# Patient Record
Sex: Female | Born: 1990 | Race: White | Hispanic: No | Marital: Single | Smoking: Never smoker
Health system: Southern US, Community
[De-identification: ages and names within clinical notes are randomized; demographics above are authoritative.]

## PROBLEM LIST (undated history)

## (undated) HISTORY — PX: WISDOM TOOTH EXTRACTION: SHX21

## (undated) HISTORY — PX: ADENOIDECTOMY: SUR15

## (undated) HISTORY — PX: TONSILLECTOMY: SUR1361

---
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
## 2016-12-22 ENCOUNTER — Ambulatory Visit (INDEPENDENT_AMBULATORY_CARE_PROVIDER_SITE_OTHER): Payer: BLUE CROSS/BLUE SHIELD | Admitting: Allergy and Immunology

## 2016-12-22 ENCOUNTER — Encounter: Payer: Self-pay | Admitting: Allergy and Immunology

## 2016-12-22 VITALS — BP 120/70 | HR 66 | Temp 98.3°F | Resp 17 | Ht 62.99 in | Wt 127.2 lb

## 2016-12-22 DIAGNOSIS — J3089 Other allergic rhinitis: Secondary | ICD-10-CM

## 2016-12-22 DIAGNOSIS — Z8709 Personal history of other diseases of the respiratory system: Secondary | ICD-10-CM | POA: Diagnosis not present

## 2016-12-22 DIAGNOSIS — Z91018 Allergy to other foods: Secondary | ICD-10-CM

## 2016-12-22 DIAGNOSIS — L500 Allergic urticaria: Secondary | ICD-10-CM | POA: Diagnosis not present

## 2016-12-22 MED ORDER — FLUTICASONE PROPIONATE 50 MCG/ACT NA SUSP: 2.0000 | Freq: Every day | NASAL | 5 refills | Status: AC | PRN

## 2016-12-22 NOTE — Assessment & Plan Note (Signed)
Unclear etiology. Skin tests to select food allergens were negative today. NSAIDs and emotional stress commonly exacerbate urticaria but are not the underlying etiology in this case. Physical urticarias are negative by history (i.e. pressure-induced, temperature, vibration, solar, etc.). History and lesions are not consistent with urticaria pigmentosa so I am not suspicious for mastocytosis. There are no concomitant symptoms concerning for anaphylaxis.   We will not order labs at this time, however, if lesions recur, persist, progress, or change in character, we will assess potential etiologies with screening labs.  For symptom relief, patient is to take oral antihistamines as directed.  If needed, take Xyzal or Allegra.  Should symptoms recur, a journal is to be kept recording any foods eaten, beverages consumed, medications taken within a 6 hour period prior to the onset of symptoms, as well as record activities being performed, and environmental conditions. For any symptoms concerning for anaphylaxis, 911 is to be called immediately.

## 2016-12-22 NOTE — Assessment & Plan Note (Signed)
The patient's history suggests shellfish allergy, however food allergen skin tests today were negative despite a positive histamine control.  The negative predictive value of food allergen skin testing is excellent, however there is still a 5% chance that the allergy exists.  A laboratory order form has been provided for serum specific IgE against shellfish panel.  If laboratory tests are negative/within normal limits, we will proceed to open graded oral challenge.  Until this food allergy has been definitively ruled out, she will continue careful avoidance of shellfish.

## 2016-12-22 NOTE — Progress Notes (Signed)
New Patient Note  RE: Mary Carter MRN: [id number] DOB: 1990/07/31 Date of Office Visit: 12/22/2016  Referring provider: No ref. provider found Primary care provider: [place], [facility]  Chief Complaint: Allergic Reaction (shellfish) and Allergy Testing   History of present illness: Mary Carter is a 26 y.o. female presents today for evaluation of possible food allergy.  She states that when she was approximately 26 years old she consumed shellfish on 2 separate occasions and vomited shortly after each of the meals.  Her mother has a shellfish allergy and therefore advocated strict avoidance of shellfish.  She had borderline positive results to shellfish with food allergen skin testing in 2009 by Dr. Willa RoughHicks.  [name] is interested in reassessing her food allergy status.  She had childhood asthma which has improved/resolved over the past several years.  Her asthma had been flared by upper respiratory tract infections. [name] states that approximately 2 weeks ago she was sitting in grass and developed "red, itchy bumps" on her legs.  She did not experience concomitant cardiopulmonary symptoms and the urticaria resolved within a few hours.  She has experienced some nasal symptoms this spring/summer consistent with allergic rhinitis, however she is uncertain if the symptoms were related to a viral infection or allergen exposure.   Assessment and plan: History of food allergy The patient's history suggests shellfish allergy, however food allergen skin tests today were negative despite a positive histamine control.  The negative predictive value of food allergen skin testing is excellent, however there is still a 5% chance that the allergy exists.  A laboratory order form has been provided for serum specific IgE against shellfish panel.  If laboratory tests are negative/within normal limits, we will proceed to open graded oral challenge.  Until this food allergy  has been definitively ruled out, she will continue careful avoidance of shellfish.  Perennial allergic rhinitis  Aeroallergen avoidance measures have been discussed and provided in written form.  A prescription has been provided for fluticasone nasal spray, 2 sprays per nostril daily as needed. Proper nasal spray technique has been discussed and demonstrated.  I have also recommended nasal saline spray (i.e. Simply Saline) as needed and prior to medicated nasal sprays.  I have recommended over-the-counter Xyzal or Allegra if needed.  Urticaria Unclear etiology. Skin tests to select food allergens were negative today. NSAIDs and emotional stress commonly exacerbate urticaria but are not the underlying etiology in this case. Physical urticarias are negative by history (i.e. pressure-induced, temperature, vibration, solar, etc.). History and lesions are not consistent with urticaria pigmentosa so I am not suspicious for mastocytosis. There are no concomitant symptoms concerning for anaphylaxis.   We will not order labs at this time, however, if lesions recur, persist, progress, or change in character, we will assess potential etiologies with screening labs.  For symptom relief, patient is to take oral antihistamines as directed.  If needed, take Xyzal or Allegra.  Should symptoms recur, a journal is to be kept recording any foods eaten, beverages consumed, medications taken within a 6 hour period prior to the onset of symptoms, as well as record activities being performed, and environmental conditions. For any symptoms concerning for anaphylaxis, 911 is to be called immediately.  History of asthma Quiescent.  Unless lower respiratory symptoms arise, we will not treat or evaluate further.   Meds ordered this encounter  Medications  . fluticasone (FLONASE) 50 MCG/ACT nasal spray    Sig: Place 2 sprays into both nostrils daily  as needed for allergies or rhinitis.    Dispense:  16 g     Refill:  5    Diagnostics: Spirometry:  Normal with an FEV1 of 84% predicted.  Please see scanned spirometry results for details. Environmental skin testing: Positive to dust mite antigen. Food allergen skin testing:  Negative despite a positive histamine control.    Physical examination: Blood pressure 120/70, pulse 66, temperature 98.3 F (36.8 C), temperature source Oral, resp. rate 17, height 5' 2.99" (1.6 m), weight 127 lb 3.2 oz (57.7 kg), SpO2 98 %.  General: Alert, interactive, in no acute distress. HEENT: TMs pearly gray, turbinates mildly edematous without discharge, post-pharynx unremarkable. Neck: Supple without lymphadenopathy. Lungs: Clear to auscultation without wheezing, rhonchi or rales. CV: Normal S1, S2 without murmurs. Abdomen: Nondistended, nontender. Skin: Warm and dry, without lesions or rashes. Extremities:  No clubbing, cyanosis or edema. Neuro:   Grossly intact.  Review of systems:  Review of systems negative except as noted in HPI / PMHx or noted below: Review of Systems  Constitutional: Negative.   HENT: Negative.   Eyes: Negative.   Respiratory: Negative.   Cardiovascular: Negative.   Gastrointestinal: Negative.   Genitourinary: Negative.   Musculoskeletal: Negative.   Skin: Negative.   Neurological: Negative.   Endo/Heme/Allergies: Negative.   Psychiatric/Behavioral: Negative.     Past medical history:  Food allergy.  Past surgical history:  Past Surgical History:  Procedure Laterality Date  . ADENOIDECTOMY    . TONSILLECTOMY    . WISDOM TOOTH EXTRACTION Bilateral     Family history: Family History  Problem Relation Age of Onset  . Food Allergy Mother        shellfish  . Asthma Maternal Grandmother   . Asthma Maternal Grandfather   . Asthma Paternal Grandmother   . Allergic rhinitis Neg Hx   . Angioedema Neg Hx   . Atopy Neg Hx   . Eczema Neg Hx   . Immunodeficiency Neg Hx   . Urticaria Neg Hx     Social history: Social  History   Social History  . Marital status: Single    Spouse name: N/A  . Number of children: N/A  . Years of education: N/A   Occupational History  . Not on file.   Social History Main Topics  . Smoking status: Never Smoker  . Smokeless tobacco: Never Used  . Alcohol use Yes  . Drug use: No  . Sexual activity: Not on file   Other Topics Concern  . Not on file   Social History Narrative  . No narrative on file   Environmental History: The patient lives in a house built in the 1930s with hardwood floors throughout, gas heat, and central air.  There is a cat in the house which does not have access to her bedroom.  There is no known mold/water damage in the home.  She is nonsmoker.  Allergies as of 12/22/2016   No Known Allergies     Medication List       Accurate as of 12/22/16 12:44 PM. Always use your most recent med list.          fluticasone 50 MCG/ACT nasal spray Commonly known as:  FLONASE Place 2 sprays into both nostrils daily as needed for allergies or rhinitis.   ranitidine 75 MG tablet Commonly known as:  ZANTAC Take 75 mg by mouth 2 (two) times daily as needed for heartburn.       Known medication allergies: No  Known Allergies  I appreciate the opportunity to take part in [place] care. Please do not hesitate to contact me with questions.  Sincerely,   R. Jorene Guest, MD

## 2016-12-22 NOTE — Assessment & Plan Note (Signed)
   Aeroallergen avoidance measures have been discussed and provided in written form.  A prescription has been provided for fluticasone nasal spray, 2 sprays per nostril daily as needed. Proper nasal spray technique has been discussed and demonstrated.  I have also recommended nasal saline spray (i.e. Simply Saline) as needed and prior to medicated nasal sprays.  I have recommended over-the-counter Xyzal or Allegra if needed.

## 2016-12-22 NOTE — Assessment & Plan Note (Signed)
Quiescent.  Unless lower respiratory symptoms arise, we will not treat or evaluate further. 

## 2016-12-22 NOTE — Patient Instructions (Addendum)
History of food allergy The patient's history suggests shellfish allergy, however food allergen skin tests today were negative despite a positive histamine control.  The negative predictive value of food allergen skin testing is excellent, however there is still a 5% chance that the allergy exists.  A laboratory order form has been provided for serum specific IgE against shellfish panel.  If laboratory tests are negative/within normal limits, we will proceed to open graded oral challenge.  Until this food allergy has been definitively ruled out, she will continue careful avoidance of shellfish.  Perennial allergic rhinitis  Aeroallergen avoidance measures have been discussed and provided in written form.  A prescription has been provided for fluticasone nasal spray, 2 sprays per nostril daily as needed. Proper nasal spray technique has been discussed and demonstrated.  I have also recommended nasal saline spray (i.e. Simply Saline) as needed and prior to medicated nasal sprays.  I have recommended over-the-counter Xyzal or Allegra if needed.  Urticaria Unclear etiology. Skin tests to select food allergens were negative today. NSAIDs and emotional stress commonly exacerbate urticaria but are not the underlying etiology in this case. Physical urticarias are negative by history (i.e. pressure-induced, temperature, vibration, solar, etc.). History and lesions are not consistent with urticaria pigmentosa so I am not suspicious for mastocytosis. There are no concomitant symptoms concerning for anaphylaxis.   We will not order labs at this time, however, if lesions recur, persist, progress, or change in character, we will assess potential etiologies with screening labs.  For symptom relief, patient is to take oral antihistamines as directed.  If needed, take Xyzal or Allegra.  Should symptoms recur, a journal is to be kept recording any foods eaten, beverages consumed, medications taken within a 6  hour period prior to the onset of symptoms, as well as record activities being performed, and environmental conditions. For any symptoms concerning for anaphylaxis, 911 is to be called immediately.  History of asthma Quiescent.  Unless lower respiratory symptoms arise, we will not treat or evaluate further.   When lab results have returned the patient will be called with further recommendations and follow up instructions.   Control of House Dust Mite Allergen  House dust mites play a major role in allergic asthma and rhinitis.  They occur in environments with high humidity wherever human skin, the food for dust mites is found. High levels have been detected in dust obtained from mattresses, pillows, carpets, upholstered furniture, bed covers, clothes and soft toys.  The principal allergen of the house dust mite is found in its feces.  A gram of dust may contain 1,000 mites and 250,000 fecal particles.  Mite antigen is easily measured in the air during house cleaning activities.    1. Encase mattresses, including the box spring, and pillow, in an air tight cover.  Seal the zipper end of the encased mattresses with wide adhesive tape. 2. Wash the bedding in water of 130 degrees Farenheit weekly.  Avoid cotton comforters/quilts and flannel bedding: the most ideal bed covering is the dacron comforter. 3. Remove all upholstered furniture from the bedroom. 4. Remove carpets, carpet padding, rugs, and non-washable window drapes from the bedroom.  Wash drapes weekly or use plastic window coverings. 5. Remove all non-washable stuffed toys from the bedroom.  Wash stuffed toys weekly. 6. Have the room cleaned frequently with a vacuum cleaner and a damp dust-mop.  The patient should not be in a room which is being cleaned and should wait 1 hour after cleaning  before going into the room. 7. Close and seal all heating outlets in the bedroom.  Otherwise, the room will become filled with dust-laden air.  An  electric heater can be used to heat the room. 8. Reduce indoor humidity to less than 50%.  Do not use a humidifier.

## 2016-12-25 LAB — ALLERGY-SHELLFISH PANEL
Clams: <0.1 kU/L
Crab: <0.1 kU/L
Lobster: <0.1 kU/L
Shrimp IgE: <0.1 kU/L

## 2017-02-23 ENCOUNTER — Ambulatory Visit (INDEPENDENT_AMBULATORY_CARE_PROVIDER_SITE_OTHER): Payer: BLUE CROSS/BLUE SHIELD | Admitting: Allergy and Immunology

## 2017-02-23 ENCOUNTER — Encounter: Payer: Self-pay | Admitting: Allergy and Immunology

## 2017-02-23 VITALS — BP 120/68 | HR 60 | Resp 18

## 2017-02-23 DIAGNOSIS — J3089 Other allergic rhinitis: Secondary | ICD-10-CM

## 2017-02-23 DIAGNOSIS — Z91018 Allergy to other foods: Secondary | ICD-10-CM | POA: Diagnosis not present

## 2017-02-23 DIAGNOSIS — L500 Allergic urticaria: Secondary | ICD-10-CM

## 2017-02-23 NOTE — Assessment & Plan Note (Signed)
Quiescent.  Should significant symptoms recur or new symptoms occur, a journal is to be kept recording any foods eaten, beverages consumed, medications taken, activities performed, and environmental conditions within a 6 hour time period prior to the onset of symptoms. For any symptoms concerning for anaphylaxis, epinephrine is to be administered and 911 is to be called immediately.

## 2017-02-23 NOTE — Progress Notes (Signed)
    Follow-up Note  RE: Raynelle JanKathryn L Zanetti MRN: [id number] DOB: 04-06-1991 Date of Office Visit: 02/23/2017  Primary care provider: [place], [facility] Referring provider: [place]oe [place], [place]*  History of present illness: Raynelle JanKathryn L Vanorman is a 26 y.o. female with history of food allergy, allergic rhinitis, and history of urticaria presenting today for open graded oral challenge against shrimp.  She has had negative skin test for shellfish and her serum specific IgE was negative/within normal limits for shellfish panel.  Her nasal symptoms a been well-controlled and she has not had recurrence of urticaria in the interval since her previous visit.  She has no new complaints today.   Assessment and plan: History of food allergy The patient had negative in vivo and in vitro tests to shellfish and was able to tolerate the open graded oral challenge today without adverse signs or symptoms. Therefore, she has the same risk of systemic reaction associated with the consumption of shrimp as the general population.  Shrimp may be re-introduced into the diet, gradually at first with access to diphenhydramine and epinephrine auto-injectors.  Perennial allergic rhinitis Stable.  Continue appropriate allergen avoidance measures, fluticasone nasal spray as needed, and over-the-counter antihistamine if needed.  History of urticaria Quiescent.  Should significant symptoms recur or new symptoms occur, a journal is to be kept recording any foods eaten, beverages consumed, medications taken, activities performed, and environmental conditions within a 6 hour time period prior to the onset of symptoms. For any symptoms concerning for anaphylaxis, epinephrine is to be administered and 911 is to be called immediately.    Diagnostics: Open graded shrimp oral challenge: The patient was able to tolerate the challenge today without adverse signs or symptoms. Vital signs were stable  throughout the challenge and observation period.     Physical examination: Blood pressure 120/68, pulse 60, resp. rate 18, SpO2 96 %.  General: Alert, interactive, in no acute distress. HEENT: TMs pearly gray, turbinates minimally edematous without discharge, post-pharynx mildly erythematous. Neck: Supple without lymphadenopathy. Lungs: Clear to auscultation without wheezing, rhonchi or rales. CV: Normal S1, S2 without murmurs. Skin: Warm and dry, without lesions or rashes.  The following portions of the patient's history were reviewed and updated as appropriate: allergies, current medications, past family history, past medical history, past social history, past surgical history and problem list.  Allergies as of 02/23/2017   No Known Allergies     Medication List       Accurate as of 02/23/17  1:09 PM. Always use your most recent med list.          fluticasone 50 MCG/ACT nasal spray Commonly known as:  FLONASE Place 2 sprays into both nostrils daily as needed for allergies or rhinitis.   ranitidine 75 MG tablet Commonly known as:  ZANTAC Take 75 mg by mouth 2 (two) times daily as needed for heartburn.       No Known Allergies  I appreciate the opportunity to take part in Winn[place]'s care. Please do not hesitate to contact me with questions.  Sincerely,   R. Jorene Guestarter Mylinh Cragg, MD

## 2017-02-23 NOTE — Patient Instructions (Signed)
History of food allergy The patient had negative in vivo and in vitro tests to shellfish and was able to tolerate the open graded oral challenge today without adverse signs or symptoms. Therefore, she has the same risk of systemic reaction associated with the consumption of shrimp as the general population.  Shrimp may be re-introduced into the diet, gradually at first with access to diphenhydramine and epinephrine auto-injectors.  Perennial allergic rhinitis Stable.  Continue appropriate allergen avoidance measures, fluticasone nasal spray as needed, and over-the-counter antihistamine if needed.  History of urticaria Quiescent.  Should significant symptoms recur or new symptoms occur, a journal is to be kept recording any foods eaten, beverages consumed, medications taken, activities performed, and environmental conditions within a 6 hour time period prior to the onset of symptoms. For any symptoms concerning for anaphylaxis, epinephrine is to be administered and 911 is to be called immediately.

## 2017-02-23 NOTE — Assessment & Plan Note (Signed)
The patient had negative in vivo and in vitro tests to shellfish and was able to tolerate the open graded oral challenge today without adverse signs or symptoms. Therefore, she has the same risk of systemic reaction associated with the consumption of shrimp as the general population.  Shrimp may be re-introduced into the diet, gradually at first with access to diphenhydramine and epinephrine auto-injectors. 

## 2017-02-23 NOTE — Assessment & Plan Note (Signed)
Stable.  Continue appropriate allergen avoidance measures, fluticasone nasal spray as needed, and over-the-counter antihistamine if needed.

## 2019-04-08 ENCOUNTER — Other Ambulatory Visit: Payer: Self-pay

## 2019-04-08 DIAGNOSIS — Z20822 Contact with and (suspected) exposure to covid-19: Secondary | ICD-10-CM

## 2019-04-09 LAB — NOVEL CORONAVIRUS, NAA: SARS-CoV-2, NAA: NOT DETECTED
